# Patient Record
Sex: Male | Born: 1970 | Race: Black or African American | Hispanic: No | Marital: Married | Smoking: Current some day smoker
Health system: Southern US, Community
[De-identification: ages and names within clinical notes are randomized; demographics above are authoritative.]

## PROBLEM LIST (undated history)

## (undated) DIAGNOSIS — K219 Gastro-esophageal reflux disease without esophagitis: Secondary | ICD-10-CM

## (undated) HISTORY — DX: Gastro-esophageal reflux disease without esophagitis: K21.9

## (undated) HISTORY — PX: BACK SURGERY: SHX140

---

## 2001-06-02 ENCOUNTER — Encounter: Payer: Self-pay | Admitting: Emergency Medicine

## 2001-06-02 ENCOUNTER — Emergency Department (HOSPITAL_COMMUNITY): Admission: EM | Admit: 2001-06-02 | Discharge: 2001-06-02 | Payer: Self-pay | Admitting: *Deleted

## 2003-11-17 ENCOUNTER — Emergency Department (HOSPITAL_COMMUNITY): Admission: EM | Admit: 2003-11-17 | Discharge: 2003-11-17 | Payer: Self-pay | Admitting: Emergency Medicine

## 2009-08-29 ENCOUNTER — Emergency Department (HOSPITAL_COMMUNITY): Admission: EM | Admit: 2009-08-29 | Discharge: 2009-08-29 | Payer: Self-pay | Admitting: Emergency Medicine

## 2009-12-11 ENCOUNTER — Inpatient Hospital Stay (HOSPITAL_COMMUNITY): Admission: RE | Admit: 2009-12-11 | Discharge: 2009-12-14 | Payer: Self-pay | Admitting: Neurological Surgery

## 2010-01-12 ENCOUNTER — Encounter: Admission: RE | Admit: 2010-01-12 | Discharge: 2010-01-12 | Payer: Self-pay | Admitting: Neurological Surgery

## 2010-05-25 ENCOUNTER — Encounter: Admission: RE | Admit: 2010-05-25 | Discharge: 2010-05-25 | Payer: Self-pay | Admitting: Neurological Surgery

## 2010-10-04 ENCOUNTER — Encounter: Payer: Self-pay | Admitting: Neurological Surgery

## 2010-12-07 LAB — CBC
Hemoglobin: 13.6 g/dL (ref 13.0–17.0)
MCHC: 32.9 g/dL (ref 30.0–36.0)
RBC: 4.51 MIL/uL (ref 4.22–5.81)
WBC: 3.2 10*3/uL — ABNORMAL LOW (ref 4.0–10.5)

## 2010-12-07 LAB — DIFFERENTIAL
Basophils Absolute: 0 10*3/uL (ref 0.0–0.1)
Basophils Relative: 1 % (ref 0–1)
Eosinophils Absolute: 0.2 10*3/uL (ref 0.0–0.7)
Eosinophils Relative: 7 % — ABNORMAL HIGH (ref 0–5)
Lymphocytes Relative: 52 % — ABNORMAL HIGH (ref 12–46)
Lymphs Abs: 1.7 10*3/uL (ref 0.7–4.0)
Monocytes Absolute: 0.4 10*3/uL (ref 0.1–1.0)
Monocytes Relative: 11 % (ref 3–12)
Neutro Abs: 0.9 10*3/uL — ABNORMAL LOW (ref 1.7–7.7)
Neutrophils Relative %: 29 % — ABNORMAL LOW (ref 43–77)

## 2010-12-07 LAB — BASIC METABOLIC PANEL
BUN: 12 mg/dL (ref 6–23)
CO2: 31 mEq/L (ref 19–32)
Calcium: 9.8 mg/dL (ref 8.4–10.5)
Chloride: 104 mEq/L (ref 96–112)
Creatinine, Ser: 0.96 mg/dL (ref 0.4–1.5)
GFR calc Af Amer: >60 mL/min (ref >60)
GFR calc non Af Amer: >60 mL/min (ref >60)
Glucose, Bld: 91 mg/dL (ref 70–99)
Potassium: 4 mEq/L (ref 3.5–5.1)
Sodium: 140 mEq/L (ref 135–145)

## 2010-12-07 LAB — PATHOLOGIST SMEAR REVIEW

## 2010-12-07 LAB — PROTIME-INR
INR: 0.99 (ref 0.00–1.49)
Prothrombin Time: 13 seconds (ref 11.6–15.2)

## 2010-12-07 LAB — APTT: aPTT: 34 seconds (ref 24–37)

## 2011-01-08 IMAGING — RF DG LUMBAR SPINE 2-3V
1 series · 3 of 3 positions shown · non-contrast
Comparison: 08/29/2009

CLINICAL DATA: L5-S1 fusion.

LUMBAR SPINE - 2-3 VIEW

[Series 1: run · 3 of 3 slices shown]
[im 1/3]
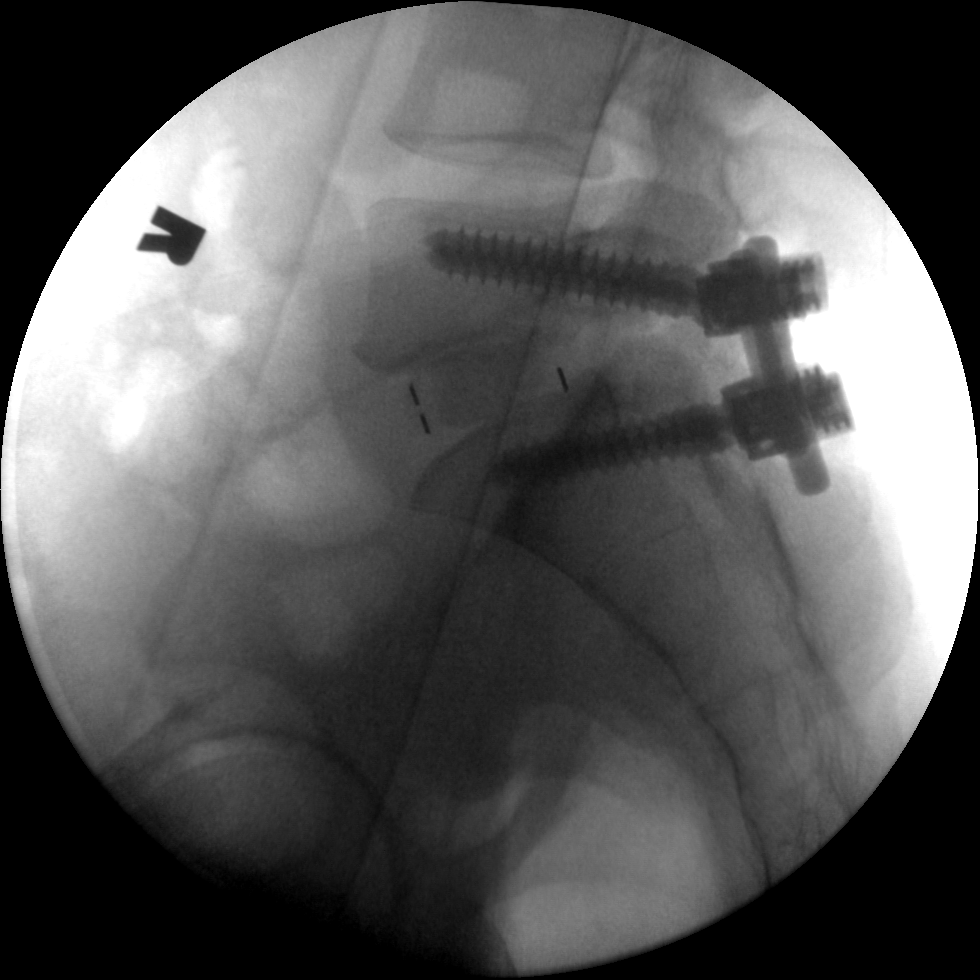
[im 2/3]
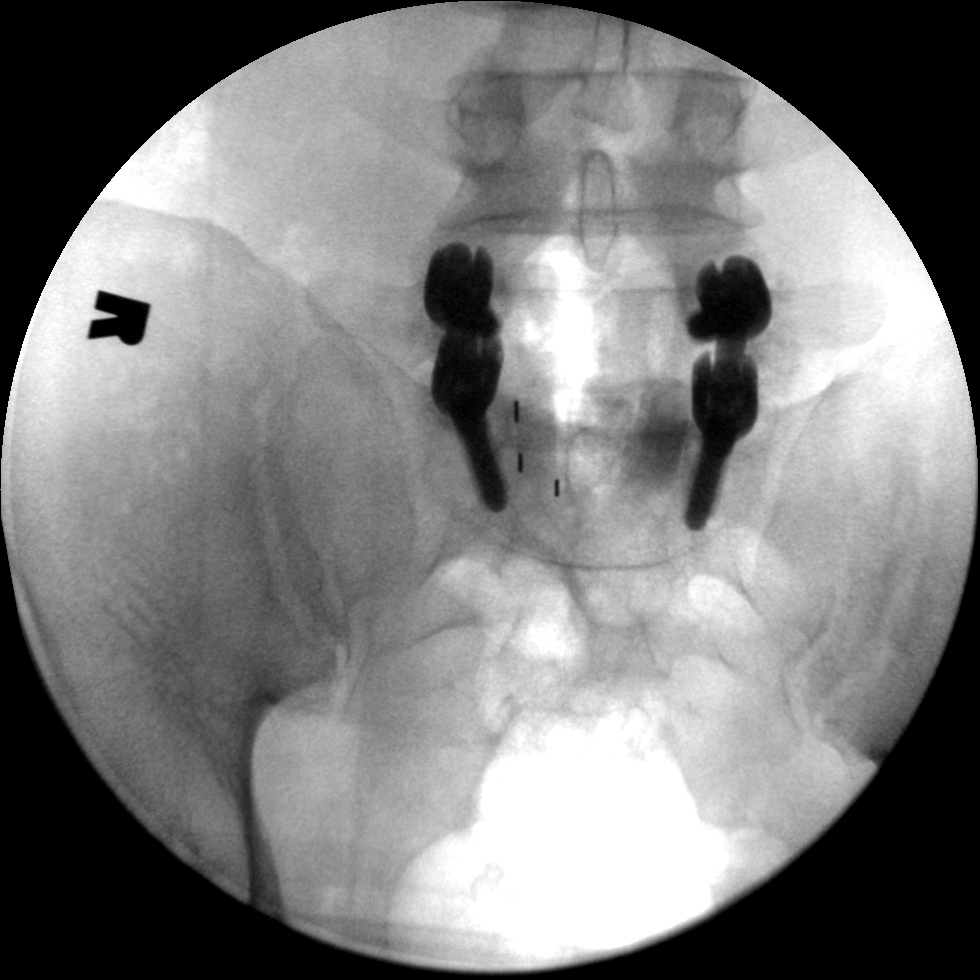
[im 3/3]
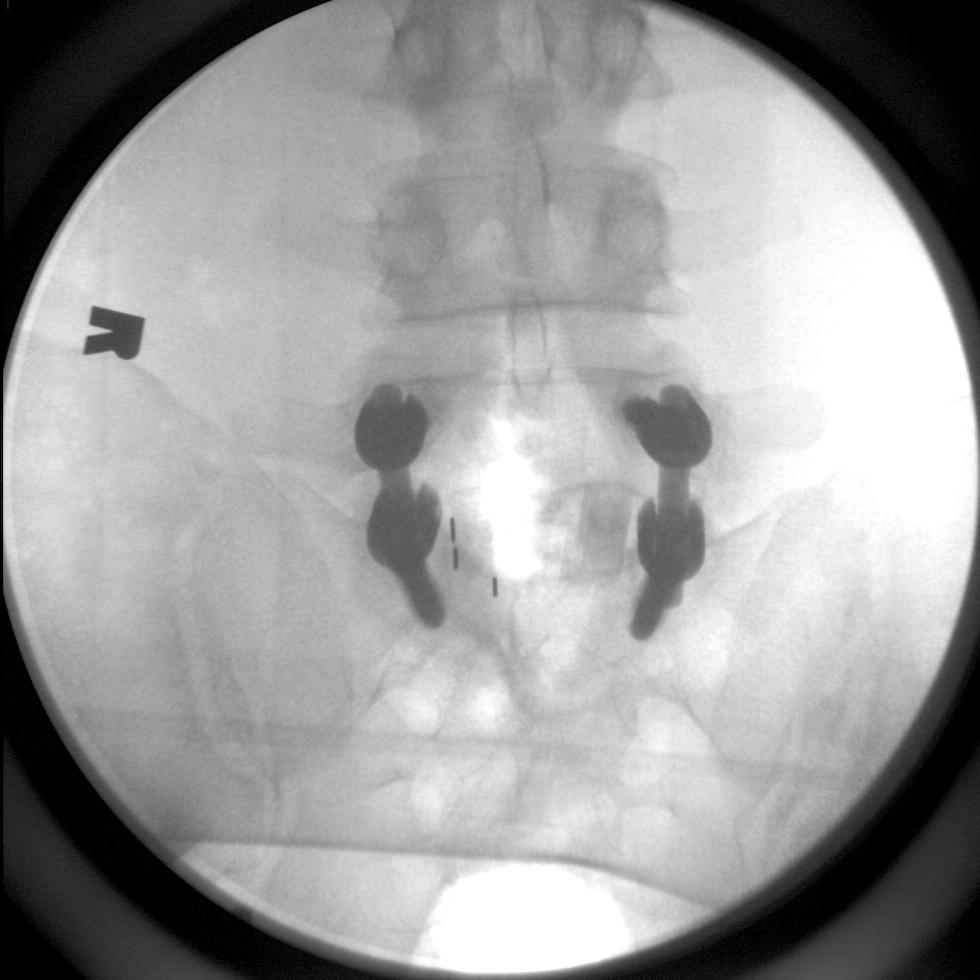

[3 of 3 positions shown; findings below may reference images not displayed]

FINDINGS: Three intraoperative spot images demonstrate changes of
posterior fusion L5-S1.  Normal alignment.
IMPRESSION: L5-S1 fusion.

## 2020-05-23 ENCOUNTER — Other Ambulatory Visit: Payer: Managed Care, Other (non HMO)

## 2020-05-23 ENCOUNTER — Other Ambulatory Visit: Payer: Self-pay

## 2020-05-23 DIAGNOSIS — Z20822 Contact with and (suspected) exposure to covid-19: Secondary | ICD-10-CM

## 2020-05-26 LAB — NOVEL CORONAVIRUS, NAA: SARS-CoV-2, NAA: NOT DETECTED

## 2022-03-18 ENCOUNTER — Encounter: Payer: Self-pay | Admitting: Cardiology

## 2022-03-18 NOTE — Progress Notes (Deleted)
    Cardiology Office Note   Date:  03/18/2022   ID:  Jeffery Fitzpatrick, DOB 12/31/1970, MRN [id number]  PCP:  No primary care provider on file.  Cardiologist:   None Referring:  ***  No chief complaint on file.     History of Present Illness: Jeffery Fitzpatrick is a 51 y.o. male who presents for evaluation of chest pain.  He was referred by ***    Past Medical History:  Diagnosis Date   GERD (gastroesophageal reflux disease)     *** The histories are not reviewed yet. Please review them in the "History" navigator section and refresh this SmartLink.   No current outpatient medications on file.   No current facility-administered medications for this visit.    Allergies:   Patient has no allergy information on record.    Social History:  The patient     Family History:  The patient's ***family history is not on file.    ROS:  Please see the history of present illness.   Otherwise, review of systems are positive for {NONE DEFAULTED:18576}.   All other systems are reviewed and negative.    PHYSICAL EXAM: VS:  There were no vitals taken for this visit. , BMI There is no height or weight on file to calculate BMI. GENERAL:  Well appearing HEENT:  Pupils equal round and reactive, fundi not visualized, oral mucosa unremarkable NECK:  No jugular venous distention, waveform within normal limits, carotid upstroke brisk and symmetric, no bruits, no thyromegaly LYMPHATICS:  No cervical, inguinal adenopathy LUNGS:  Clear to auscultation bilaterally BACK:  No CVA tenderness CHEST:  Unremarkable HEART:  PMI not displaced or sustained,S1 and S2 within normal limits, no S3, no S4, no clicks, no rubs, *** murmurs ABD:  Flat, positive bowel sounds normal in frequency in pitch, no bruits, no rebound, no guarding, no midline pulsatile mass, no hepatomegaly, no splenomegaly EXT:  2 plus pulses throughout, no edema, no cyanosis no clubbing SKIN:  No rashes no nodules NEURO:  Cranial  nerves II through XII grossly intact, motor grossly intact throughout PSYCH:  Cognitively intact, oriented to person place and time    EKG:  EKG {ACTION; IS/IS UMP:53614431} ordered today. The ekg ordered today demonstrates ***   Recent Labs: No results found for requested labs within last 365 days.    Lipid Panel No results found for: "CHOL", "TRIG", "HDL", "CHOLHDL", "VLDL", "LDLCALC", "LDLDIRECT"    Wt Readings from Last 3 Encounters:  No data found for Wt      Other studies Reviewed: Additional studies/ records that were reviewed today include: ***. Review of the above records demonstrates:  Please see elsewhere in the note.  ***   ASSESSMENT AND PLAN:  Precordial chest pain:  ***   Current medicines are reviewed at length with the patient today.  The patient {ACTIONS; HAS/DOES NOT HAVE:19233} concerns regarding medicines.  The following changes have been made:  {PLAN; NO CHANGE:13088:s}  Labs/ tests ordered today include: *** No orders of the defined types were placed in this encounter.    Disposition:   FU with ***    Signed, Rollene Rotunda, MD  03/18/2022 8:23 AM    [facility]

## 2022-03-19 ENCOUNTER — Ambulatory Visit: Payer: Managed Care, Other (non HMO) | Admitting: Cardiology

## 2022-04-19 ENCOUNTER — Encounter: Payer: Self-pay | Admitting: Internal Medicine

## 2022-04-19 ENCOUNTER — Ambulatory Visit (INDEPENDENT_AMBULATORY_CARE_PROVIDER_SITE_OTHER): Payer: Managed Care, Other (non HMO) | Admitting: Internal Medicine

## 2022-04-19 VITALS — BP 126/82 | HR 58 | Ht 67.0 in | Wt 174.4 lb

## 2022-04-19 DIAGNOSIS — R079 Chest pain, unspecified: Secondary | ICD-10-CM

## 2022-04-19 NOTE — Progress Notes (Signed)
Cardiology Office Note:    Date:  04/19/2022   ID:  LAMINE LATON, DOB 18-Feb-1971, MRN [id number]  PCP:  Carilyn Goodpasture, NP   [facility] Providers Cardiologist:  None     Referring MD: Carilyn Goodpasture, NP   No chief complaint on file. Chest pain  History of Present Illness:    LINDLEY HINEY is a 51 y.o. male with a hx of GERD, referral for CP. He gets sharp pain every now and then. This started 3-4 years ago. It's not consistent. He wasn't concerned, his wife was. He denies smoking. Mother has a few stents. His uncle has PCI.  No cardiac disease hx for him.  Past Medical History:  Diagnosis Date   GERD (gastroesophageal reflux disease)       Current Medications: Current Meds  Medication Sig   omeprazole (PRILOSEC) 20 MG capsule Take 1 capsule by mouth as directed.   sildenafil (VIAGRA) 25 MG tablet Take 1 tablet by mouth as directed.     Allergies:   Patient has no known allergies.   Social History   Socioeconomic History   Marital status: Married    Spouse name: Not on file   Number of children: Not on file   Years of education: Not on file   Highest education level: Not on file  Occupational History   Not on file  Tobacco Use   Smoking status: Some Days    Types: Cigarettes, Cigars    Start date: 04/14/2020   Smokeless tobacco: Never  Substance and Sexual Activity   Alcohol use: Never   Drug use: Not on file   Sexual activity: Yes  Other Topics Concern   Not on file  Social History Narrative   Not on file   Social Determinants of Health   Financial Resource Strain: Not on file  Food Insecurity: Not on file  Transportation Needs: Not on file  Physical Activity: Not on file  Stress: Not on file  Social Connections: Not on file     Family History: The patient's family history includes Breast cancer in his mother; Heart attack in his mother; Hypertension in his father and mother.  ROS:   Please see the history of present illness.      All other systems reviewed and are negative.  EKGs/Labs/Other Studies Reviewed:    The following studies were reviewed today:   EKG:  EKG is  ordered today.  The ekg ordered today demonstrates   04/19/2022- sinus bradycardia, no St changes  Recent Labs: No results found for requested labs within last 365 days.  Recent Lipid Panel No results found for: "CHOL", "TRIG", "HDL", "CHOLHDL", "VLDL", "LDLCALC", "LDLDIRECT"   Risk Assessment/Calculations:           Physical Exam:    VS:  BP 126/82   Pulse (!) 58   Ht 5\' 7"  (1.702 m)   Wt 174 lb 6.4 oz (79.1 kg)   SpO2 95%   BMI 27.31 kg/m     Wt Readings from Last 3 Encounters:  04/19/22 174 lb 6.4 oz (79.1 kg)     GEN:  Well nourished, well developed in no acute distress HEENT: Normal NECK: No JVD; No carotid bruits LYMPHATICS: No lymphadenopathy CARDIAC: RRR, no murmurs, rubs, gallops RESPIRATORY:  Clear to auscultation without rales, wheezing or rhonchi  ABDOMEN: Soft, non-tender, non-distended MUSCULOSKELETAL:  No edema; No deformity  SKIN: Warm and dry NEUROLOGIC:  Alert and oriented x 3 PSYCHIATRIC:  Normal affect  ASSESSMENT:    Non cardiac chest pain: His symptoms of occasional sharp pains are not concerning for cardiac dx. No further work up is recommended PLAN:    In order of problems listed above:  No further w/u is recommended for noncardiac CP at this time           Medication Adjustments/Labs and Tests Ordered: Current medicines are reviewed at length with the patient today.  Concerns regarding medicines are outlined above.  Orders Placed This Encounter  Procedures   EKG 12-Lead   No orders of the defined types were placed in this encounter.   Patient Instructions  Medication Instructions:  Your physician recommends that you continue on your current medications as directed. Please refer to the Current Medication list given to you today.  *If you need a refill on your cardiac  medications before your next appointment, please call your pharmacy*  Follow-Up: At [facility], you and your health needs are our priority.  As part of our continuing mission to provide you with exceptional heart care, we have created designated Provider Care Teams.  These Care Teams include your primary Cardiologist (physician) and Advanced Practice Providers (APPs -  Physician Assistants and Nurse Practitioners) who all work together to provide you with the care you need, when you need it.  We recommend signing up for the patient portal called "MyChart".  Sign up information is provided on this After Visit Summary.  MyChart is used to connect with patients for Virtual Visits (Telemedicine).  Patients are able to view lab/test results, encounter notes, upcoming appointments, etc.  Non-urgent messages can be sent to your provider as well.   To learn more about what you can do with MyChart, go to [email or web address].    Your next appointment:    AS NEEDED with Dr. Wyline Mood    Signed, Maisie Fus, MD  04/19/2022 9:17 AM     [facility]

## 2022-04-19 NOTE — Patient Instructions (Signed)
Medication Instructions:  Your physician recommends that you continue on your current medications as directed. Please refer to the Current Medication list given to you today.  *If you need a refill on your cardiac medications before your next appointment, please call your pharmacy*   Follow-Up: At [facility], you and your health needs are our priority.  As part of our continuing mission to provide you with exceptional heart care, we have created designated Provider Care Teams.  These Care Teams include your primary Cardiologist (physician) and Advanced Practice Providers (APPs -  Physician Assistants and Nurse Practitioners) who all work together to provide you with the care you need, when you need it.  We recommend signing up for the patient portal called "MyChart".  Sign up information is provided on this After Visit Summary.  MyChart is used to connect with patients for Virtual Visits (Telemedicine).  Patients are able to view lab/test results, encounter notes, upcoming appointments, etc.  Non-urgent messages can be sent to your provider as well.   To learn more about what you can do with MyChart, go to [email or web address].    Your next appointment:   AS NEEDED with Dr. Branch  

## 2023-08-25 ENCOUNTER — Emergency Department (HOSPITAL_COMMUNITY)
Admission: EM | Admit: 2023-08-25 | Discharge: 2023-08-25 | Disposition: A | Discharge status: Home / Self Care | Payer: BLUE CROSS/BLUE SHIELD | Attending: Emergency Medicine | Admitting: Emergency Medicine

## 2023-08-25 ENCOUNTER — Emergency Department (HOSPITAL_COMMUNITY): Payer: BLUE CROSS/BLUE SHIELD

## 2023-08-25 ENCOUNTER — Encounter (HOSPITAL_COMMUNITY): Payer: Self-pay

## 2023-08-25 DIAGNOSIS — S29011A Strain of muscle and tendon of front wall of thorax, initial encounter: Secondary | ICD-10-CM | POA: Insufficient documentation

## 2023-08-25 DIAGNOSIS — Y92410 Unspecified street and highway as the place of occurrence of the external cause: Secondary | ICD-10-CM | POA: Insufficient documentation

## 2023-08-25 DIAGNOSIS — S46812A Strain of other muscles, fascia and tendons at shoulder and upper arm level, left arm, initial encounter: Secondary | ICD-10-CM | POA: Diagnosis not present

## 2023-08-25 DIAGNOSIS — R079 Chest pain, unspecified: Secondary | ICD-10-CM | POA: Diagnosis present

## 2023-08-25 MED ORDER — IBUPROFEN 400 MG PO TABS
600.0000 mg | ORAL_TABLET | Freq: Once | ORAL | Status: AC
  Administered 2023-08-25: 600 mg via ORAL
  Filled 2023-08-25: qty 1
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
## 2023-08-25 NOTE — Discharge Instructions (Addendum)
You may use NSAIDs such as ibuprofen, Advil, Aleve, etc. and Tylenol to help with your pain.  Apply ice to the affected areas.  If you develop new or worsening pain, trouble breathing, numbness or weakness in your arms or legs, or any other new/concerning symptoms and return to the ER or call 911.
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
